# Patient Record
Sex: Male | Born: 1965 | Race: Black or African American | Hispanic: No | Marital: Married | Smoking: Current every day smoker
Health system: Southern US, Community
[De-identification: ages and names within clinical notes are randomized; demographics above are authoritative.]

## PROBLEM LIST (undated history)

## (undated) DIAGNOSIS — I10 Essential (primary) hypertension: Secondary | ICD-10-CM

## (undated) DIAGNOSIS — N400 Enlarged prostate without lower urinary tract symptoms: Secondary | ICD-10-CM

## (undated) HISTORY — DX: Essential (primary) hypertension: I10

---

## 2006-05-21 ENCOUNTER — Ambulatory Visit: Payer: Self-pay | Admitting: Family Medicine

## 2009-12-29 ENCOUNTER — Ambulatory Visit: Payer: Self-pay | Admitting: Urology

## 2011-05-13 IMAGING — CR DG ABDOMEN 1V
1 series · 2 of 2 positions shown · non-contrast
Comparison: none

REASON FOR EXAM: left flank pain
COMMENTS:

PROCEDURE:     DXR - DXR KIDNEY URETER BLADDER  - December 29, 2009 [DATE]
RESULT:     Comparisons:  None

[Series 1: view not recorded · 0.17mm/px · 2 of 2 slices shown]
[im 1/2]
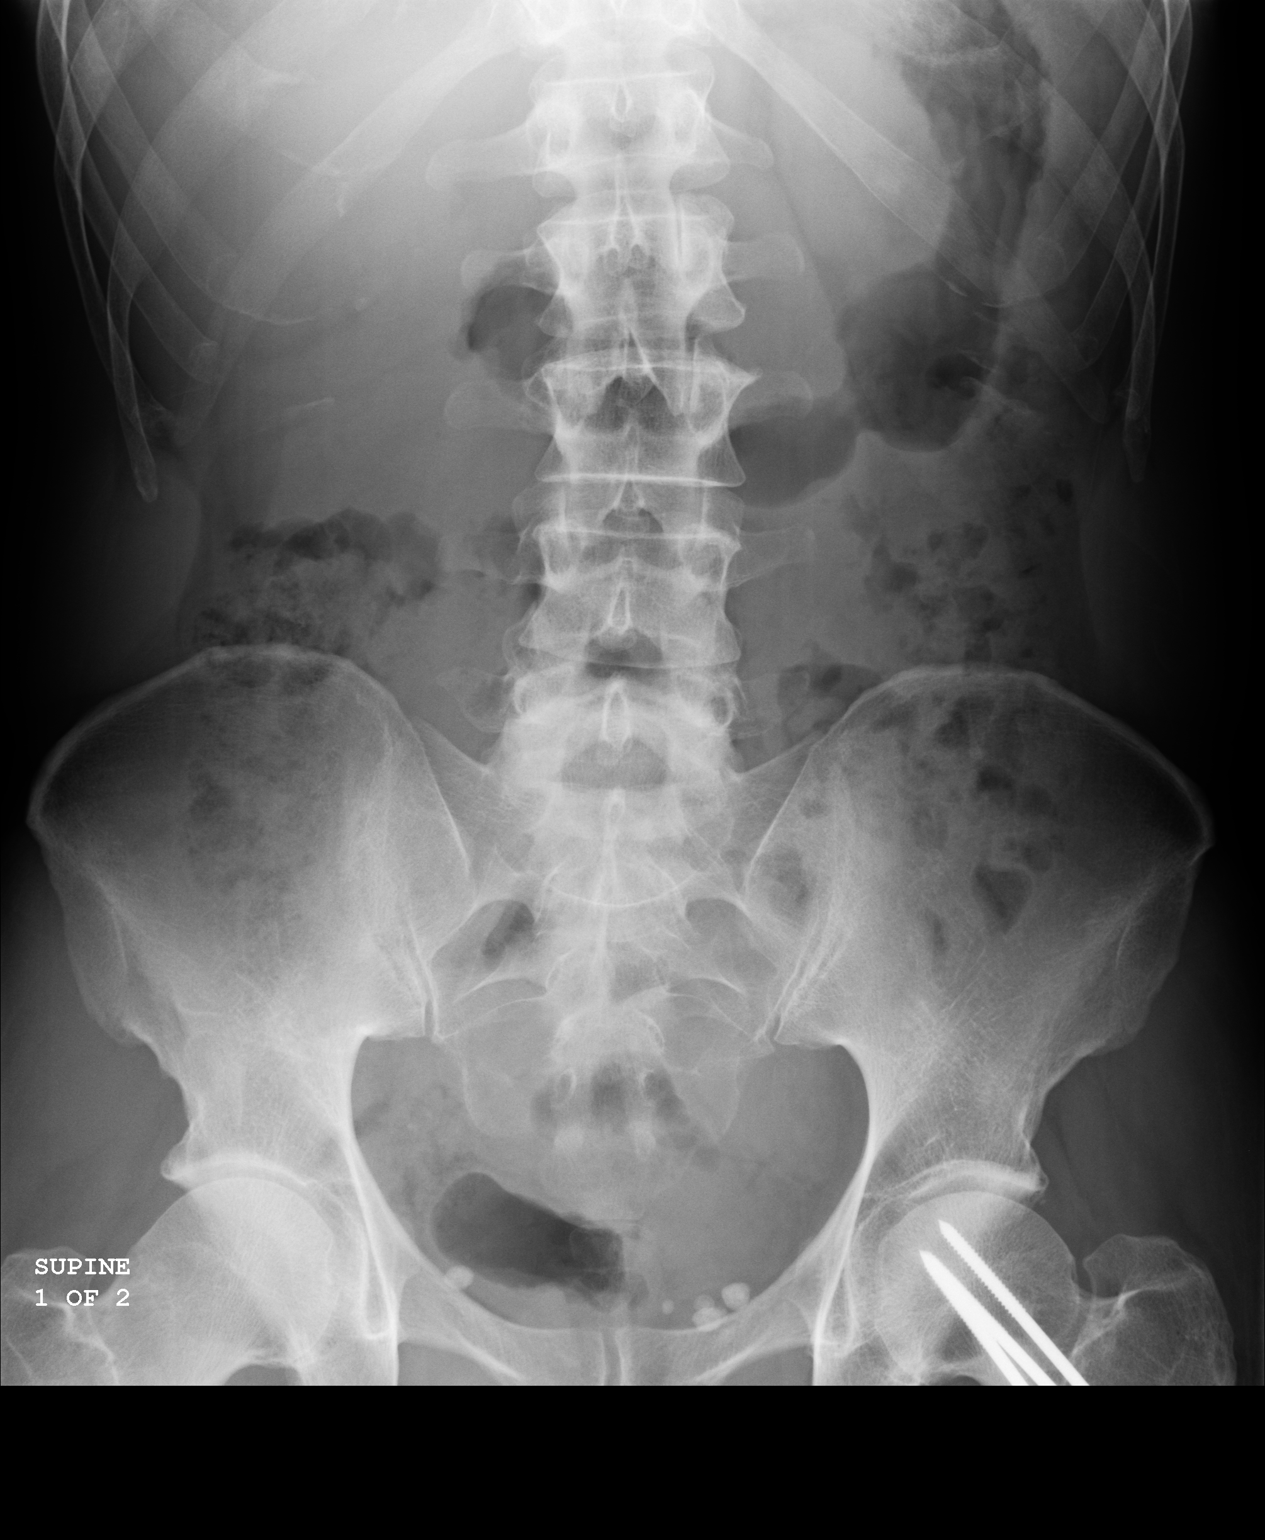
[im 2/2]
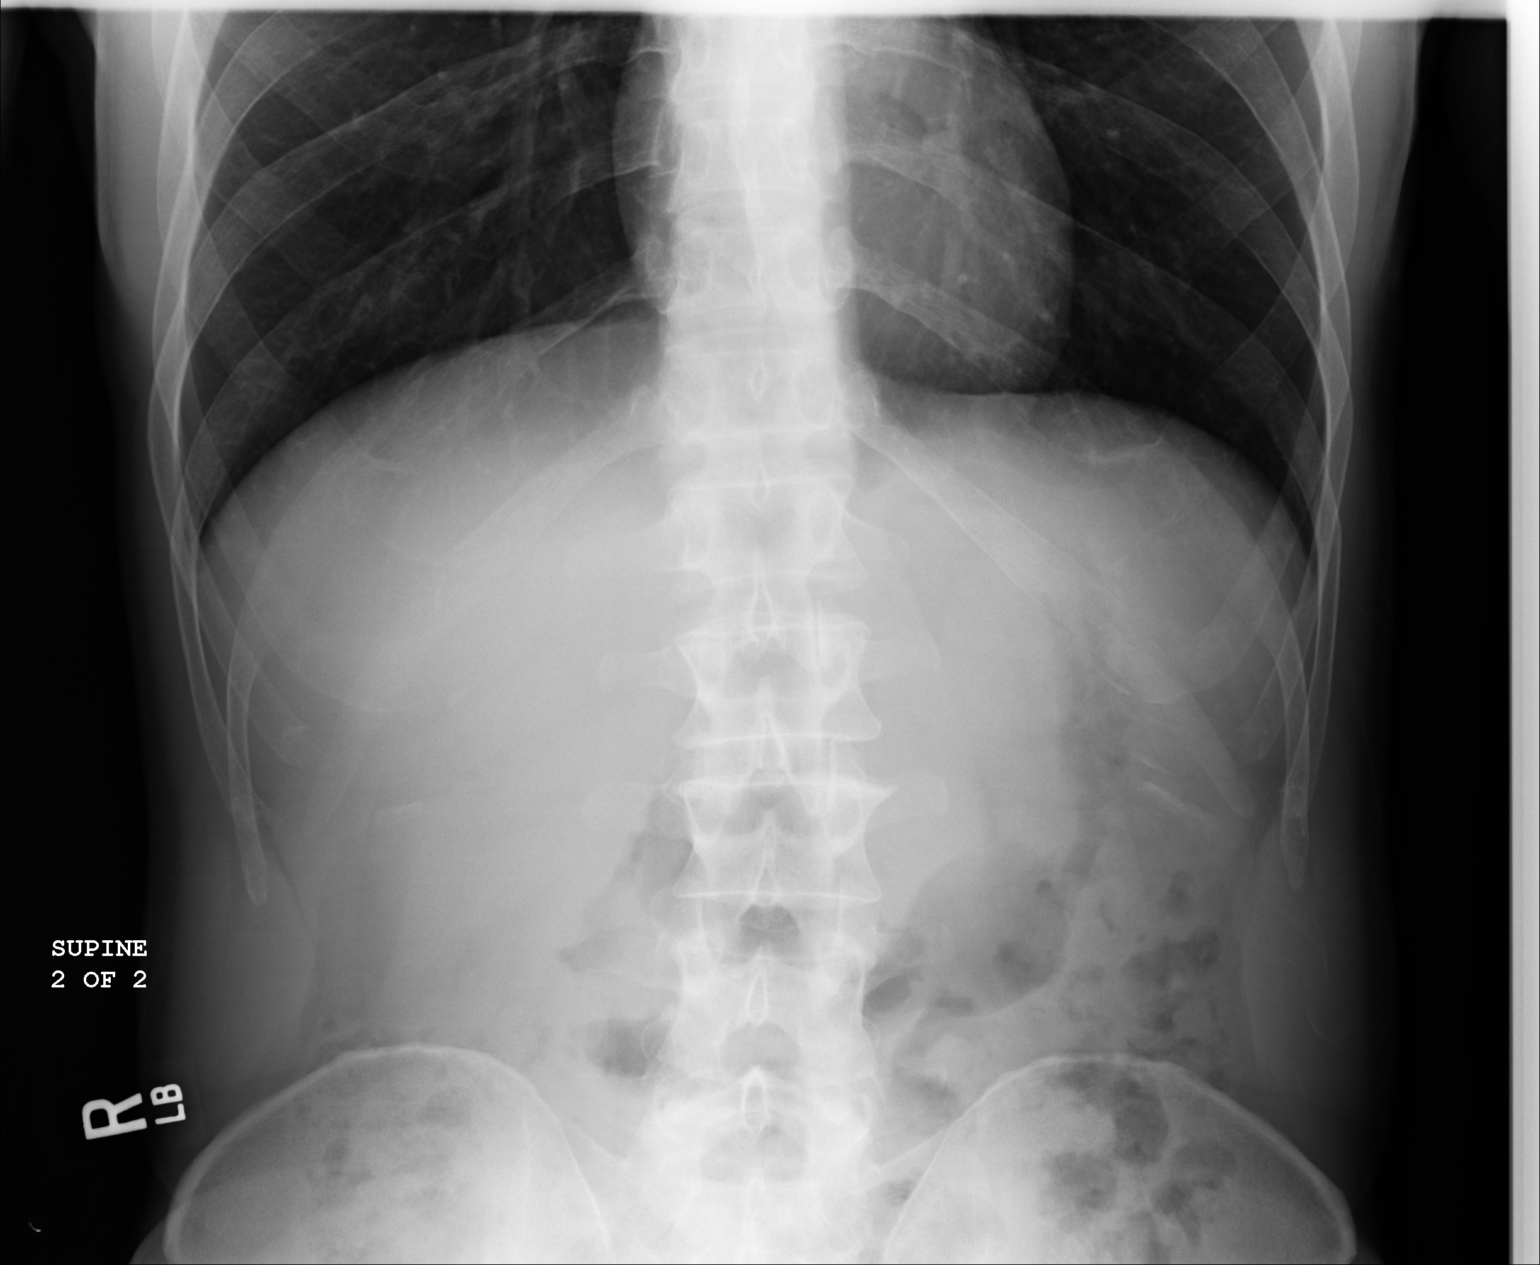

[2 of 2 positions shown; findings below may reference images not displayed]

FINDINGS: Supine and upright views of the abdomen are provided.

There is a nonspecific bowel gas pattern. There is no bowel dilatation to
suggest obstruction. There are no air-fluid levels. There is no pathologic
calcification along the expected course of the ureters.There is no evidence
of pneumoperitoneum, portal venous gas, or pneumatosis.

The osseous structures are unremarkable. There are 3 pins within the left
femoral neck.
IMPRESSION: Unremarkable abdominal radiograph.

## 2017-05-16 ENCOUNTER — Ambulatory Visit: Payer: BC Managed Care – PPO | Admitting: Urology

## 2017-05-16 ENCOUNTER — Encounter: Payer: Self-pay | Admitting: Urology

## 2017-05-16 VITALS — BP 155/97 | HR 93 | Ht 72.0 in | Wt 155.2 lb

## 2017-05-16 DIAGNOSIS — N529 Male erectile dysfunction, unspecified: Secondary | ICD-10-CM | POA: Diagnosis not present

## 2017-05-16 DIAGNOSIS — R6882 Decreased libido: Secondary | ICD-10-CM

## 2017-05-16 MED ORDER — SILDENAFIL CITRATE 20 MG PO TABS: ORAL_TABLET | ORAL | 0 refills | Status: AC

## 2017-05-16 NOTE — Progress Notes (Signed)
05/16/2017 2:06 PM   Tom Barnes Feb 13, 1966 [id number]  Referring provider: No referring provider defined for this encounter.  Chief Complaint  Patient presents with  . Erectile Dysfunction    HPI: Tom Leydenrchie Barnes is a 52 year old male who presents for evaluation of erectile dysfunction and low libido.  He presents with a 7368-month history of intermittent erectile dysfunction.  He states on occasions he has difficulty achieving and maintaining an erection however at other times has no problems.  He denies pain or curvature with erections.  Organic risk factors include hypertension and antihypertensive medications.  He also complains of moderate decrease in his sex drive.  He has no voiding complaints.  He denies dysuria or gross hematuria.  He states I saw him several years ago for "prostate issues".   PMH: Past Medical History:  Diagnosis Date  . Hypertension     Surgical History: History reviewed. No pertinent surgical history.  Home Medications:  Allergies as of 05/16/2017   No Known Allergies     Medication List        Accurate as of 05/16/17  2:06 PM. Always use your most recent med list.          lisinopril-hydrochlorothiazide 10-12.5 MG tablet Commonly known as:  PRINZIDE,ZESTORETIC Take by mouth.       Allergies: No Known Allergies  Family History: Family History  Problem Relation Age of Onset  . Prostate cancer Neg Hx   . Bladder Cancer Neg Hx   . Kidney cancer Neg Hx     Social History:  reports that he has been smoking e-cigarettes.  He has never used smokeless tobacco. He reports that he drinks alcohol. He reports that he does not use drugs.  ROS: UROLOGY Frequent Urination?: No Hard to postpone urination?: Yes Burning/pain with urination?: No Get up at night to urinate?: No Leakage of urine?: No Urine stream starts and stops?: No Trouble starting stream?: No Do you have to strain to urinate?: No Blood in urine?: No Urinary tract  infection?: No Sexually transmitted disease?: No Injury to kidneys or bladder?: No Painful intercourse?: No Weak stream?: No Erection problems?: Yes Penile pain?: No  Gastrointestinal Nausea?: No Vomiting?: No Indigestion/heartburn?: No Diarrhea?: No Constipation?: No  Constitutional Fever: No Night sweats?: No Weight loss?: No Fatigue?: No  Skin Skin rash/lesions?: No Itching?: No  Eyes Blurred vision?: No Double vision?: No  Ears/Nose/Throat Sore throat?: No Sinus problems?: No  Hematologic/Lymphatic Swollen glands?: No Easy bruising?: No  Cardiovascular Leg swelling?: No Chest pain?: No  Respiratory Cough?: No Shortness of breath?: No  Endocrine Excessive thirst?: No  Musculoskeletal Back pain?: No Joint pain?: No  Neurological Headaches?: No Dizziness?: No  Psychologic Depression?: No Anxiety?: No  Physical Exam: BP (!) 155/97 (BP Location: Left Arm, Patient Position: Sitting, Cuff Size: Normal)   Pulse 93   Ht 6' (1.829 m)   Wt 155 lb 3.2 oz (70.4 kg)   BMI 21.05 kg/m   Constitutional:  Alert and oriented, No acute distress. HEENT: [place] AT, moist mucus membranes.  Trachea midline, no masses. Cardiovascular: No clubbing, cyanosis, or edema. Respiratory: Normal respiratory effort, no increased work of breathing. GI: Abdomen is soft, nontender, nondistended, no abdominal masses GU: No CVA tenderness.  Penis circumcised without lesions.  No corporal plaques.  Testes normal size, descended bilaterally without masses or tenderness.  No paratesticular abnormalities. Lymph: No cervical or inguinal lymphadenopathy. Skin: No rashes, bruises or suspicious lesions. Neurologic: Grossly intact, no focal deficits, moving  all 4 extremities. Psychiatric: Normal mood and affect.   Assessment & Plan:   52 year old male with intermittent erectile dysfunction and low libido.  Discussed possibility that at times he may have erectile dysfunction secondary  to stress or tiredness/fatigue.  He will return for an a.m. testosterone level and LH.  He was interested in a trial of PDE 5 medication and Rx generic sildenafil was sent.  He will be notified with his lab results and further recommendations.    Riki Altes, MD  [facility] [address], [address] [place], [place] [address] [phone]

## 2017-05-19 ENCOUNTER — Other Ambulatory Visit: Payer: BC Managed Care – PPO

## 2017-05-19 DIAGNOSIS — R6882 Decreased libido: Secondary | ICD-10-CM

## 2017-05-19 DIAGNOSIS — N529 Male erectile dysfunction, unspecified: Secondary | ICD-10-CM

## 2017-05-20 LAB — TESTOSTERONE: TESTOSTERONE: 764 ng/dL (ref 264–916)

## 2017-05-20 LAB — LUTEINIZING HORMONE: LH: 6.1 m[IU]/mL (ref 1.7–8.6)

## 2017-05-21 ENCOUNTER — Telehealth: Payer: Self-pay

## 2017-05-21 NOTE — Telephone Encounter (Signed)
lmom 

## 2022-08-02 ENCOUNTER — Other Ambulatory Visit: Payer: Self-pay

## 2022-08-02 DIAGNOSIS — Z0000 Encounter for general adult medical examination without abnormal findings: Secondary | ICD-10-CM

## 2022-08-02 DIAGNOSIS — Z9189 Other specified personal risk factors, not elsewhere classified: Secondary | ICD-10-CM

## 2022-08-09 ENCOUNTER — Ambulatory Visit
Admission: RE | Admit: 2022-08-09 | Discharge: 2022-08-09 | Disposition: A | Discharge status: Home / Self Care | Payer: BC Managed Care – PPO | Source: Ambulatory Visit | Attending: Family Medicine | Admitting: Family Medicine

## 2022-08-09 DIAGNOSIS — Z0000 Encounter for general adult medical examination without abnormal findings: Secondary | ICD-10-CM

## 2022-08-09 DIAGNOSIS — Z9189 Other specified personal risk factors, not elsewhere classified: Secondary | ICD-10-CM | POA: Insufficient documentation

## 2022-08-14 ENCOUNTER — Ambulatory Visit: Payer: BC Managed Care – PPO | Admitting: Urology

## 2022-08-14 ENCOUNTER — Encounter: Payer: Self-pay | Admitting: Urology

## 2022-08-14 VITALS — BP 156/86 | HR 87 | Ht 72.0 in | Wt 168.0 lb

## 2022-08-14 DIAGNOSIS — R972 Elevated prostate specific antigen [PSA]: Secondary | ICD-10-CM | POA: Diagnosis not present

## 2022-08-14 MED ORDER — TAMSULOSIN HCL 0.4 MG PO CAPS: 0.4000 mg | ORAL_CAPSULE | Freq: Every day | ORAL | 1 refills | Status: DC

## 2022-08-14 NOTE — Progress Notes (Signed)
I, Duke Salvia, acting as a scribe for Riki Altes, MD., have documented all relevant documentation on the behalf of Riki Altes, MD, as directed by  Riki Altes, MD while in the presence of Riki Altes, MD.   08/14/2022 12:26 PM   Tom Barnes 04/13/1965 [id number]  Referring provider: Marisue Ivan, MD [phone] [facility] [address] [facility] [place],  [place] [address]  Chief Complaint  Patient presents with   Elevated PSA    HPI: Tom Barnes is a 57 y.o. male referred for evaluation of elevated PSA.  PSA 07/25/2022 elevated at 13.18. Previous PSA 2017 was 3.88. Urinalysis performed 08/01/2022 showed no pyuria and a urine culture was negative. At the time his PSA was drawn, he was complaining of a slow urinary stream. He was started empirically on Cipro, though is having some lower leg discomfort. No family history of prostate cancer. Has also noted pelvic burning sensation over the last few weeks   PMH: Past Medical History:  Diagnosis Date   Hypertension     Home Medications:  Allergies as of 08/14/2022   No Known Allergies      Medication List        Accurate as of August 14, 2022 12:26 PM. If you have any questions, ask your nurse or doctor.          STOP taking these medications    ciprofloxacin 500 MG tablet Commonly known as: CIPRO Stopped by: Riki Altes, MD   cyclobenzaprine 10 MG tablet Commonly known as: FLEXERIL Stopped by: Riki Altes, MD       TAKE these medications    lisinopril-hydrochlorothiazide 10-12.5 MG tablet Commonly known as: ZESTORETIC Take by mouth.   sildenafil 20 MG tablet Commonly known as: REVATIO 2-5 tabs 1 hour prior to intercourse   tamsulosin 0.4 MG Caps capsule Commonly known as: FLOMAX Take 1 capsule (0.4 mg total) by mouth daily. Started by: Riki Altes, MD        Family History: Family History  Problem Relation Age of Onset   Prostate cancer Neg Hx     Bladder Cancer Neg Hx    Kidney cancer Neg Hx     Social History:  reports that he has been smoking e-cigarettes. He has never used smokeless tobacco. He reports current alcohol use. He reports that he does not use drugs.   Physical Exam: BP (!) 156/86   Pulse 87   Ht 6' (1.829 m)   Wt 168 lb (76.2 kg)   BMI 22.78 kg/m   Constitutional:  Alert and oriented, No acute distress. HEENT: [place] AT Respiratory: Normal respiratory effort, no increased work of breathing. GI: Abdomen is soft, nontender, nondistended, no abdominal masses GU: Prostate 70 g, smooth without nodules. Psychiatric: Normal mood and affect.    Assessment & Plan:    1. Elevated PSA Benign DRE. Recommend discontinuing Cipro since he had a negative urinalysis and negative culture. Although PSA is a prostate cancer screening test he was informed that cancer is not the most common cause of an elevated PSA. Other potential causes including BPH and inflammation were discussed. He was informed that the only way to adequately diagnose prostate cancer would be a transrectal ultrasound and biopsy of the prostate. The procedure was discussed including potential risks of bleeding and infection/sepsis. He was also informed that a negative biopsy does not conclusively rule out the possibility that prostate cancer may be present and that continued  monitoring is required. The use of newer adjunctive blood tests including PHI and 4kScore were discussed. The use of multiparametric prostate MRI to evaluate for abnormality suspicious for high-grade prostate cancer and aid in targeted biopsy was reviewed. Continued periodic surveillance was also discussed.  Initially recommended an alpha blocker x30 days with a repeat PSA in 1 month. If PSA persistently elevated, will schedule prostate MRI.  I have reviewed the above documentation for accuracy and completeness, and I agree with the above.   Riki Altes, MD  [facility] [address] [place], [place] [address] [phone]

## 2022-09-13 ENCOUNTER — Other Ambulatory Visit: Payer: BC Managed Care – PPO

## 2022-09-13 DIAGNOSIS — R972 Elevated prostate specific antigen [PSA]: Secondary | ICD-10-CM

## 2022-09-14 LAB — PSA: Prostate Specific Ag, Serum: 8.9 ng/mL — ABNORMAL HIGH (ref 0.0–4.0)

## 2022-09-15 ENCOUNTER — Encounter: Payer: Self-pay | Admitting: Urology

## 2022-09-16 ENCOUNTER — Other Ambulatory Visit: Payer: Self-pay | Admitting: *Deleted

## 2022-09-16 DIAGNOSIS — R972 Elevated prostate specific antigen [PSA]: Secondary | ICD-10-CM

## 2022-09-16 MED ORDER — TAMSULOSIN HCL 0.4 MG PO CAPS: 0.4000 mg | ORAL_CAPSULE | Freq: Every day | ORAL | 1 refills | Status: DC

## 2022-09-20 ENCOUNTER — Ambulatory Visit
Admission: EM | Admit: 2022-09-20 | Discharge: 2022-09-20 | Disposition: A | Discharge status: Home / Self Care | Payer: BC Managed Care – PPO | Attending: Emergency Medicine | Admitting: Emergency Medicine

## 2022-09-20 DIAGNOSIS — J069 Acute upper respiratory infection, unspecified: Secondary | ICD-10-CM

## 2022-09-20 HISTORY — DX: Benign prostatic hyperplasia without lower urinary tract symptoms: N40.0

## 2022-09-20 MED ORDER — AZITHROMYCIN 250 MG PO TABS: 250.0000 mg | ORAL_TABLET | Freq: Every day | ORAL | 0 refills | Status: AC

## 2022-09-20 NOTE — ED Provider Notes (Signed)
Tom Barnes    CSN: [id number] Arrival date & time: 09/20/22  1940      History   Chief Complaint Chief Complaint  Patient presents with   URI    HPI Tom Barnes is a 57 y.o. male.  Accompanied by his wife, patient presents with 3-day history of congestion and cough.  He has been treating his symptoms with DayQuil and NyQuil without relief.  He has had a low-grade fever.  No rash, sore throat, shortness of breath, or other symptoms.  His medical history includes hypertension.  Patient and his wife are going out of town to [facility] for 1 week starting tomorrow.  The history is provided by the patient, the spouse and medical records.    Past Medical History:  Diagnosis Date   BPH (benign prostatic hyperplasia)    Hypertension     There are no problems to display for this patient.   History reviewed. No pertinent surgical history.     Home Medications    Prior to Admission medications   Medication Sig Start Date End Date Taking? Authorizing Provider  azithromycin (ZITHROMAX) 250 MG tablet Take 1 tablet (250 mg total) by mouth daily. Take first 2 tablets together, then 1 every day until finished. 09/20/22  Yes Mickie Bail, NP  lisinopril-hydrochlorothiazide (PRINZIDE,ZESTORETIC) 10-12.5 MG tablet Take by mouth. 05/13/17   [provider]  sildenafil (REVATIO) 20 MG tablet 2-5 tabs 1 hour prior to intercourse 05/16/17   Stoioff, Verna Czech, MD  tamsulosin (FLOMAX) 0.4 MG CAPS capsule Take 1 capsule (0.4 mg total) by mouth daily. 09/16/22   Riki Altes, MD    Family History Family History  Problem Relation Age of Onset   Prostate cancer Neg Hx    Bladder Cancer Neg Hx    Kidney cancer Neg Hx     Social History Social History   Tobacco Use   Smoking status: Every Day    Types: E-cigarettes   Smokeless tobacco: Never  Substance Use Topics   Alcohol use: Yes   Drug use: Never     Allergies   Patient has no known  allergies.   Review of Systems Review of Systems  Constitutional:  Positive for fatigue and fever. Negative for chills.  HENT:  Positive for congestion. Negative for ear pain and sore throat.   Respiratory:  Positive for cough. Negative for shortness of breath.   Cardiovascular:  Negative for chest pain and palpitations.  Gastrointestinal:  Negative for diarrhea and vomiting.     Physical Exam Triage Vital Signs ED Triage Vitals  Encounter Vitals Group     BP 09/20/22 1948 (!) 149/88     Systolic BP Percentile --      Diastolic BP Percentile --      Pulse Rate 09/20/22 1945 (!) 105     Resp 09/20/22 1945 18     Temp 09/20/22 1945 100 F (37.8 C)     Temp src --      SpO2 09/20/22 1945 96 %     Weight --      Height --      Head Circumference --      Peak Flow --      Pain Score 09/20/22 1945 0     Pain Loc --      Pain Education --      Exclude from Growth Chart --    No data found.  Updated Vital Signs BP (!) 149/88  Pulse (!) 105   Temp 100 F (37.8 C)   Resp 18   SpO2 96%   Visual Acuity Right Eye Distance:   Left Eye Distance:   Bilateral Distance:    Right Eye Near:   Left Eye Near:    Bilateral Near:     Physical Exam Vitals and nursing note reviewed.  Constitutional:      General: He is not in acute distress.    Appearance: He is well-developed.  HENT:     Right Ear: Tympanic membrane normal.     Left Ear: Tympanic membrane normal.     Nose: Congestion and rhinorrhea present.     Mouth/Throat:     Mouth: Mucous membranes are moist.     Pharynx: Oropharynx is clear.  Cardiovascular:     Rate and Rhythm: Normal rate and regular rhythm.     Heart sounds: Normal heart sounds.  Pulmonary:     Effort: Pulmonary effort is normal. No respiratory distress.     Breath sounds: Normal breath sounds.  Musculoskeletal:     Cervical back: Neck supple.  Skin:    General: Skin is warm and dry.  Neurological:     Mental Status: He is alert.   Psychiatric:        Mood and Affect: Mood normal.        Behavior: Behavior normal.      UC Treatments / Results  Labs (all labs ordered are listed, but only abnormal results are displayed) Labs Reviewed - No data to display  EKG   Radiology No results found.  Procedures Procedures (including critical care time)  Medications Ordered in UC Medications - No data to display  Initial Impression / Assessment and Plan / UC Course  I have reviewed the triage vital signs and the nursing notes.  Pertinent labs & imaging results that were available during my care of the patient were reviewed by me and considered in my medical decision making (see chart for details).    Acute URI.  Patient has been symptomatic for 3 days.  He is not improving with symptomatic care so far.  Discussed continued symptomatic care for another 2 to 3 days and if not improving, may start Zithromax.  Patient and his wife are going out of town to [facility] starting tomorrow for 1 week.  Prescription for Zithromax sent to his pharmacy so that he will have this available if needed.  Education provided on URI.  Patient agrees to plan of care.  Final Clinical Impressions(s) / UC Diagnoses   Final diagnoses:  Acute upper respiratory infection     Discharge Instructions      Continue symptomatic treatment.  If your symptoms are not improving in 2 to 3 days, start the Zithromax as directed.  Follow-up with your primary care provider.     ED Prescriptions     Medication Sig Dispense Auth. Provider   azithromycin (ZITHROMAX) 250 MG tablet Take 1 tablet (250 mg total) by mouth daily. Take first 2 tablets together, then 1 every day until finished. 6 tablet Mickie Bail, NP      PDMP not reviewed this encounter.   Mickie Bail, NP 09/20/22 2009

## 2022-09-20 NOTE — Discharge Instructions (Addendum)
Continue symptomatic treatment.  If your symptoms are not improving in 2 to 3 days, start the Zithromax as directed.  Follow-up with your primary care provider.

## 2022-09-20 NOTE — ED Triage Notes (Signed)
Patient to Urgent Care with complaints of cold-like symptoms including: dry cough/ nasal congestion/ low grade fevers.  Wife sick w/ same symptoms. Symptoms started two/ three days ago.  Has been taking alker selzter plus/ nyquil/ dayquil.

## 2022-10-09 NOTE — Addendum Note (Signed)
Addended by: Levada Schilling on: 10/09/2022 02:56 PM   Modules accepted: Orders

## 2022-10-31 ENCOUNTER — Encounter: Payer: Self-pay | Admitting: Urology

## 2022-11-01 NOTE — Addendum Note (Signed)
Addended by: Levada Schilling on: 11/01/2022 02:59 PM   Modules accepted: Orders

## 2022-11-22 ENCOUNTER — Ambulatory Visit
Admission: RE | Admit: 2022-11-22 | Discharge: 2022-11-22 | Disposition: A | Discharge status: Home / Self Care | Payer: BC Managed Care – PPO | Source: Ambulatory Visit | Attending: Urology | Admitting: Urology

## 2022-11-22 DIAGNOSIS — R972 Elevated prostate specific antigen [PSA]: Secondary | ICD-10-CM | POA: Insufficient documentation

## 2022-11-22 MED ORDER — GADOBUTROL 1 MMOL/ML IV SOLN
7.5000 mL | Freq: Once | INTRAVENOUS | Status: AC | PRN
  Administered 2022-11-22: 7.5 mL via INTRAVENOUS

## 2022-11-25 ENCOUNTER — Other Ambulatory Visit: Payer: Self-pay | Admitting: Cardiology

## 2022-11-25 DIAGNOSIS — E7800 Pure hypercholesterolemia, unspecified: Secondary | ICD-10-CM

## 2022-11-25 DIAGNOSIS — I10 Essential (primary) hypertension: Secondary | ICD-10-CM

## 2022-11-25 DIAGNOSIS — I2510 Atherosclerotic heart disease of native coronary artery without angina pectoris: Secondary | ICD-10-CM

## 2022-11-25 DIAGNOSIS — R0789 Other chest pain: Secondary | ICD-10-CM

## 2022-11-26 ENCOUNTER — Telehealth: Payer: Self-pay | Admitting: Urology

## 2022-11-26 ENCOUNTER — Encounter: Payer: Self-pay | Admitting: *Deleted

## 2022-11-26 NOTE — Telephone Encounter (Signed)
Advised patient  to have a prostate biopsy .

## 2022-11-26 NOTE — Telephone Encounter (Signed)
Patient called and stated that he got his MRI results, but has some questions about areas of suspicion, and biopsy questions. He would like someone to call him back at [phone]

## 2022-12-02 ENCOUNTER — Encounter: Payer: Self-pay | Admitting: Urology

## 2022-12-02 ENCOUNTER — Ambulatory Visit: Payer: BC Managed Care – PPO | Admitting: Urology

## 2022-12-02 VITALS — BP 131/82 | HR 66 | Ht 72.0 in | Wt 168.0 lb

## 2022-12-02 DIAGNOSIS — Z2989 Encounter for other specified prophylactic measures: Secondary | ICD-10-CM | POA: Diagnosis not present

## 2022-12-02 DIAGNOSIS — R972 Elevated prostate specific antigen [PSA]: Secondary | ICD-10-CM | POA: Diagnosis not present

## 2022-12-02 MED ORDER — LEVOFLOXACIN 500 MG PO TABS
500.0000 mg | ORAL_TABLET | Freq: Once | ORAL | Status: AC
Start: 2022-12-02 — End: 2022-12-02
  Administered 2022-12-02: 500 mg via ORAL

## 2022-12-02 MED ORDER — GENTAMICIN SULFATE 40 MG/ML IJ SOLN
80.0000 mg | Freq: Once | INTRAMUSCULAR | Status: AC
Start: 2022-12-02 — End: 2022-12-02
  Administered 2022-12-02: 80 mg via INTRAMUSCULAR

## 2022-12-02 NOTE — Progress Notes (Unsigned)
   Prostate Biopsy Procedure   Informed consent was obtained after discussing risks/benefits of the procedure.  A time out was performed to ensure correct patient identity.  Pre-Procedure: - Last PSA Level: 8.03 September 2022 - MRI 10/2022 80 cc gland; PI-RADS 5 right posterolateral PZ; PI-RADS 4 left posterolateral PZ apex - Gentamicin given prophylactically - Levaquin 500 mg administered PO -Transrectal Ultrasound performed revealing a 57 gm prostate -Hypoechoic PZ right posterolateral mid gland-apex identified and cognitive biopsies included in right mid, lateral mid and apical biopsies.  Hypoechoic area left PZ at apex identified and targeted.  Intravesical median lobe noted  Procedure: - Prostate block performed using 10 cc 1% lidocaine and biopsies taken from sextant areas, a total of 12 under ultrasound guidance.  Post-Procedure: - Patient tolerated the procedure well - He was counseled to seek immediate medical attention if experiences any severe pain, significant bleeding, or fevers -Follow-up appointment for results schedule   Irineo Axon, MD

## 2022-12-03 ENCOUNTER — Telehealth: Payer: Self-pay

## 2022-12-03 NOTE — Telephone Encounter (Signed)
Incoming call from [place] [facility] calling to clarify how many cassettes were sent in on this pt from prostate biopsy yesterday.   Confirmed with Shanda Bumps, CMA that 12 cassettes were sent however one cassette contains 2 specimens making for 13 specimens total. Bill voiced understanding.

## 2022-12-04 ENCOUNTER — Telehealth (HOSPITAL_COMMUNITY): Payer: Self-pay | Admitting: *Deleted

## 2022-12-04 NOTE — Telephone Encounter (Signed)
Reaching out to patient to offer assistance regarding upcoming cardiac imaging study; pt verbalizes understanding of appt date/time, parking situation and where to check in, pre-test NPO status and medications ordered, and verified current allergies; name and call back number provided for further questions should they arise Hayley Sharpe RN Navigator Cardiac Imaging [facility] [phone] office [phone] cell  

## 2022-12-05 ENCOUNTER — Ambulatory Visit
Admission: RE | Admit: 2022-12-05 | Discharge: 2022-12-05 | Disposition: A | Discharge status: Home / Self Care | Payer: BC Managed Care – PPO | Source: Ambulatory Visit | Attending: Cardiology | Admitting: Cardiology

## 2022-12-05 DIAGNOSIS — E7800 Pure hypercholesterolemia, unspecified: Secondary | ICD-10-CM | POA: Diagnosis present

## 2022-12-05 DIAGNOSIS — I2584 Coronary atherosclerosis due to calcified coronary lesion: Secondary | ICD-10-CM | POA: Insufficient documentation

## 2022-12-05 DIAGNOSIS — R0789 Other chest pain: Secondary | ICD-10-CM | POA: Diagnosis present

## 2022-12-05 DIAGNOSIS — R072 Precordial pain: Secondary | ICD-10-CM

## 2022-12-05 DIAGNOSIS — I10 Essential (primary) hypertension: Secondary | ICD-10-CM | POA: Insufficient documentation

## 2022-12-05 DIAGNOSIS — I2510 Atherosclerotic heart disease of native coronary artery without angina pectoris: Secondary | ICD-10-CM | POA: Insufficient documentation

## 2022-12-05 MED ORDER — NITROGLYCERIN 0.4 MG SL SUBL
0.8000 mg | SUBLINGUAL_TABLET | Freq: Once | SUBLINGUAL | Status: AC
  Administered 2022-12-05: 0.8 mg via SUBLINGUAL

## 2022-12-05 MED ORDER — SODIUM CHLORIDE 0.9 % IV SOLN: INTRAVENOUS | Status: DC

## 2022-12-05 MED ORDER — IOHEXOL 350 MG/ML SOLN
75.0000 mL | Freq: Once | INTRAVENOUS | Status: AC | PRN
  Administered 2022-12-05: 75 mL via INTRAVENOUS

## 2022-12-05 NOTE — Progress Notes (Signed)
Pt tolerated procedure well with no issues. Pt ABCs intact. Pt denies any complaints. Pt encouraged to drink plenty of water throughout the day. Pt ambulatory with steady gait.

## 2022-12-13 ENCOUNTER — Encounter: Payer: Self-pay | Admitting: Urology

## 2022-12-18 ENCOUNTER — Other Ambulatory Visit: Payer: Self-pay | Admitting: Urology

## 2022-12-20 ENCOUNTER — Ambulatory Visit: Payer: BC Managed Care – PPO | Admitting: Urology

## 2022-12-31 ENCOUNTER — Other Ambulatory Visit: Payer: Self-pay | Admitting: Physician Assistant

## 2022-12-31 DIAGNOSIS — R1031 Right lower quadrant pain: Secondary | ICD-10-CM

## 2023-02-28 ENCOUNTER — Other Ambulatory Visit: Payer: Self-pay | Admitting: Urology

## 2023-05-11 ENCOUNTER — Other Ambulatory Visit: Payer: Self-pay | Admitting: Urology

## 2023-06-02 ENCOUNTER — Other Ambulatory Visit: Payer: Self-pay | Admitting: *Deleted

## 2023-06-02 DIAGNOSIS — R972 Elevated prostate specific antigen [PSA]: Secondary | ICD-10-CM

## 2023-06-11 ENCOUNTER — Other Ambulatory Visit: Payer: Self-pay

## 2023-06-11 DIAGNOSIS — R972 Elevated prostate specific antigen [PSA]: Secondary | ICD-10-CM

## 2023-06-12 LAB — PSA: Prostate Specific Ag, Serum: 10.6 ng/mL — ABNORMAL HIGH (ref 0.0–4.0)

## 2023-06-13 ENCOUNTER — Other Ambulatory Visit: Payer: BC Managed Care – PPO

## 2023-06-16 ENCOUNTER — Ambulatory Visit: Payer: BC Managed Care – PPO | Admitting: Urology

## 2023-06-18 ENCOUNTER — Ambulatory Visit: Payer: BC Managed Care – PPO | Admitting: Urology

## 2023-06-25 ENCOUNTER — Ambulatory Visit: Payer: Self-pay | Admitting: Urology

## 2023-07-13 ENCOUNTER — Other Ambulatory Visit: Payer: Self-pay | Admitting: Urology

## 2023-09-17 ENCOUNTER — Other Ambulatory Visit: Payer: Self-pay | Admitting: Urology

## 2023-11-15 ENCOUNTER — Other Ambulatory Visit: Payer: Self-pay | Admitting: Urology

## 2023-12-16 ENCOUNTER — Other Ambulatory Visit: Payer: Self-pay | Admitting: Urology

## 2023-12-17 ENCOUNTER — Ambulatory Visit (INDEPENDENT_AMBULATORY_CARE_PROVIDER_SITE_OTHER): Admitting: Urology

## 2023-12-17 ENCOUNTER — Encounter: Payer: Self-pay | Admitting: Urology

## 2023-12-17 VITALS — BP 158/94 | HR 82 | Ht 72.0 in | Wt 175.0 lb

## 2023-12-17 DIAGNOSIS — R3912 Poor urinary stream: Secondary | ICD-10-CM

## 2023-12-17 DIAGNOSIS — N401 Enlarged prostate with lower urinary tract symptoms: Secondary | ICD-10-CM

## 2023-12-17 DIAGNOSIS — R972 Elevated prostate specific antigen [PSA]: Secondary | ICD-10-CM | POA: Diagnosis not present

## 2023-12-17 MED ORDER — TAMSULOSIN HCL 0.4 MG PO CAPS: 0.4000 mg | ORAL_CAPSULE | Freq: Every day | ORAL | 1 refills | Status: DC

## 2023-12-17 NOTE — Progress Notes (Signed)
   12/17/2023 7:55 AM   Cortlan ONEIDA Kenner 07/20/1965 [id number]  Referring provider: Alla Amis, MD [phone] [facility] [address] [facility] [place],  [place] [address]  Chief Complaint  Patient presents with   Medication Refill   Urologic history:  1.  Elevated PSA Initially seen 08/14/2022 for a PSA of 13.13 Jul 2022; repeat PSA 08/2022 8.9 MRI 80 cc gland with PI-RADS 5 lesion right posterolateral PZ and PI-RADS 4 lesion left posterolateral PZ Prostate biopsy 12/02/2022 with no cancer and multiple cores chronic inflammation  2.  BPH with LUTS Tamsulosin  0.4 mg daily  HPI: Tom Barnes is a 58 y.o. male who presents for medication refill.  Remains on tamsulosin  and has no bothersome lower urinary tract symptoms on this medication PSA 06/11/2022 stable 10.6 Denies dysuria, gross hematuria No flank, abdominal or pelvic pain   PMH: Past Medical History:  Diagnosis Date   BPH (benign prostatic hyperplasia)    Hypertension     Surgical History: History reviewed. No pertinent surgical history.  Home Medications:  Allergies as of 12/17/2023   No Known Allergies      Medication List        Accurate as of December 17, 2023  7:55 AM. If you have any questions, ask your nurse or doctor.          amLODipine 5 MG tablet Commonly known as: NORVASC Take 5 mg by mouth daily.   aspirin EC 81 MG tablet Take 81 mg by mouth.   azithromycin  250 MG tablet Commonly known as: ZITHROMAX  Take 1 tablet (250 mg total) by mouth daily. Take first 2 tablets together, then 1 every day until finished.   lisinopril-hydrochlorothiazide 10-12.5 MG tablet Commonly known as: ZESTORETIC Take by mouth.   rosuvastatin 20 MG tablet Commonly known as: CRESTOR Take 20 mg by mouth daily.   sildenafil  20 MG tablet Commonly known as: REVATIO  2-5 tabs 1 hour prior to intercourse   tamsulosin  0.4 MG Caps capsule Commonly known as: FLOMAX  TAKE 1 CAPSULE BY MOUTH EVERY DAY         Allergies: No Known Allergies  Family History: Family History  Problem Relation Age of Onset   Prostate cancer Neg Hx    Bladder Cancer Neg Hx    Kidney cancer Neg Hx     Social History:  reports that he has been smoking e-cigarettes. He has never used smokeless tobacco. He reports current alcohol use. He reports that he does not use drugs.   Physical Exam: BP (!) 158/94   Pulse 82   Ht 6' (1.829 m)   Wt 175 lb (79.4 kg)   BMI 23.73 kg/m   Constitutional:  Alert, No acute distress. HEENT: [place] AT Respiratory: Normal respiratory effort, no increased work of breathing. Psychiatric: Normal mood and affect.   Assessment & Plan:    1.  BPH with LUTS Stable on tamsulosin ; refill sent to pharmacy  2.  Elevated PSA Repeat PSA today   Glendia JAYSON Barba, MD  [facility] [address], [address] [place], [place] [address] [phone]

## 2023-12-18 ENCOUNTER — Ambulatory Visit: Payer: Self-pay | Admitting: Urology

## 2023-12-18 ENCOUNTER — Other Ambulatory Visit: Payer: Self-pay | Admitting: *Deleted

## 2023-12-18 DIAGNOSIS — R972 Elevated prostate specific antigen [PSA]: Secondary | ICD-10-CM

## 2023-12-18 LAB — PSA: Prostate Specific Ag, Serum: 12.7 ng/mL — ABNORMAL HIGH (ref 0.0–4.0)

## 2024-02-02 ENCOUNTER — Other Ambulatory Visit: Payer: Self-pay

## 2024-02-02 DIAGNOSIS — R972 Elevated prostate specific antigen [PSA]: Secondary | ICD-10-CM

## 2024-02-03 ENCOUNTER — Encounter: Payer: Self-pay | Admitting: Urology

## 2024-02-03 ENCOUNTER — Other Ambulatory Visit

## 2024-02-14 ENCOUNTER — Other Ambulatory Visit: Payer: Self-pay | Admitting: Urology

## 2024-12-17 ENCOUNTER — Ambulatory Visit: Admitting: Urology
